# Patient Record
Sex: Female | Born: 1995 | Race: Black or African American | Hispanic: No | Marital: Single | Smoking: Never smoker
Health system: Southern US, Community
[De-identification: ages and names within clinical notes are randomized; demographics above are authoritative.]

## PROBLEM LIST (undated history)

## (undated) DIAGNOSIS — J45909 Unspecified asthma, uncomplicated: Secondary | ICD-10-CM

## (undated) DIAGNOSIS — I10 Essential (primary) hypertension: Secondary | ICD-10-CM

---

## 2015-01-10 ENCOUNTER — Emergency Department (HOSPITAL_COMMUNITY)
Admission: EM | Admit: 2015-01-10 | Discharge: 2015-01-10 | Discharge status: Against Medical Advice | Payer: Medicaid - Out of State | Attending: Emergency Medicine | Admitting: Emergency Medicine

## 2015-01-10 ENCOUNTER — Emergency Department (HOSPITAL_COMMUNITY): Payer: Medicaid - Out of State

## 2015-01-10 ENCOUNTER — Encounter (HOSPITAL_COMMUNITY): Payer: Self-pay | Admitting: *Deleted

## 2015-01-10 DIAGNOSIS — R05 Cough: Secondary | ICD-10-CM | POA: Insufficient documentation

## 2015-01-10 DIAGNOSIS — R0981 Nasal congestion: Secondary | ICD-10-CM | POA: Insufficient documentation

## 2015-01-10 DIAGNOSIS — R079 Chest pain, unspecified: Secondary | ICD-10-CM | POA: Insufficient documentation

## 2015-01-10 DIAGNOSIS — I10 Essential (primary) hypertension: Secondary | ICD-10-CM | POA: Diagnosis not present

## 2015-01-10 DIAGNOSIS — R109 Unspecified abdominal pain: Secondary | ICD-10-CM | POA: Diagnosis not present

## 2015-01-10 HISTORY — DX: Essential (primary) hypertension: I10

## 2015-01-10 LAB — COMPREHENSIVE METABOLIC PANEL
ALBUMIN: 4 g/dL (ref 3.5–5.0)
ALK PHOS: 72 U/L (ref 38–126)
ALT: 9 U/L — ABNORMAL LOW (ref 14–54)
ANION GAP: 5 (ref 5–15)
AST: 14 U/L — AB (ref 15–41)
BILIRUBIN TOTAL: 1.2 mg/dL (ref 0.3–1.2)
BUN: 7 mg/dL (ref 6–20)
CALCIUM: 9.2 mg/dL (ref 8.9–10.3)
CO2: 28 mmol/L (ref 22–32)
Chloride: 105 mmol/L (ref 101–111)
Creatinine, Ser: 0.73 mg/dL (ref 0.44–1.00)
GFR calc Af Amer: >60 mL/min (ref >60)
GFR calc non Af Amer: >60 mL/min (ref >60)
GLUCOSE: 91 mg/dL (ref 65–99)
POTASSIUM: 4.2 mmol/L (ref 3.5–5.1)
SODIUM: 138 mmol/L (ref 135–145)
Total Protein: 7.5 g/dL (ref 6.5–8.1)

## 2015-01-10 LAB — CBC
HEMATOCRIT: 38.4 % (ref 36.0–46.0)
Hemoglobin: 12.7 g/dL (ref 12.0–15.0)
MCH: 29.8 pg (ref 26.0–34.0)
MCHC: 33.1 g/dL (ref 30.0–36.0)
MCV: 90.1 fL (ref 78.0–100.0)
Platelets: 356 10*3/uL (ref 150–400)
RBC: 4.26 MIL/uL (ref 3.87–5.11)
RDW: 13.5 % (ref 11.5–15.5)
WBC: 9.3 10*3/uL (ref 4.0–10.5)

## 2015-01-10 LAB — LIPASE, BLOOD: Lipase: 26 U/L (ref 11–51)

## 2015-01-10 LAB — I-STAT TROPONIN, ED: Troponin i, poc: 0 ng/mL (ref 0.00–0.08)

## 2015-01-10 NOTE — ED Notes (Signed)
Called to take to treatment room  No response from lobby 

## 2015-01-10 NOTE — ED Notes (Signed)
Pt asked rn how long it was going to be till she got a room, rn explained she was unsure. Pt said she needed to go pick up her daughter. Unsure if pt is staying or leaving.

## 2015-01-10 NOTE — ED Notes (Signed)
Pt reports chest pain, body aches, non productive cough, nasal congestion, and abdominal pain at c section x3 days. Pain 8/10. Denies hematuria. Last bowel movement yesterday. Reports nausea, denies vomiting or diarrhea.

## 2015-01-10 NOTE — ED Notes (Signed)
Pt called to take to treatment room  No response  

## 2017-04-29 IMAGING — CR DG CHEST 2V
2 series · 2 of 2 positions shown · non-contrast
Comparison: None.

CLINICAL DATA: 19-year-old with 1 week history of mid chest pain
and shortness of breath. Current history of asthma.

EXAM:
CHEST  2 VIEW

[w chest pa]
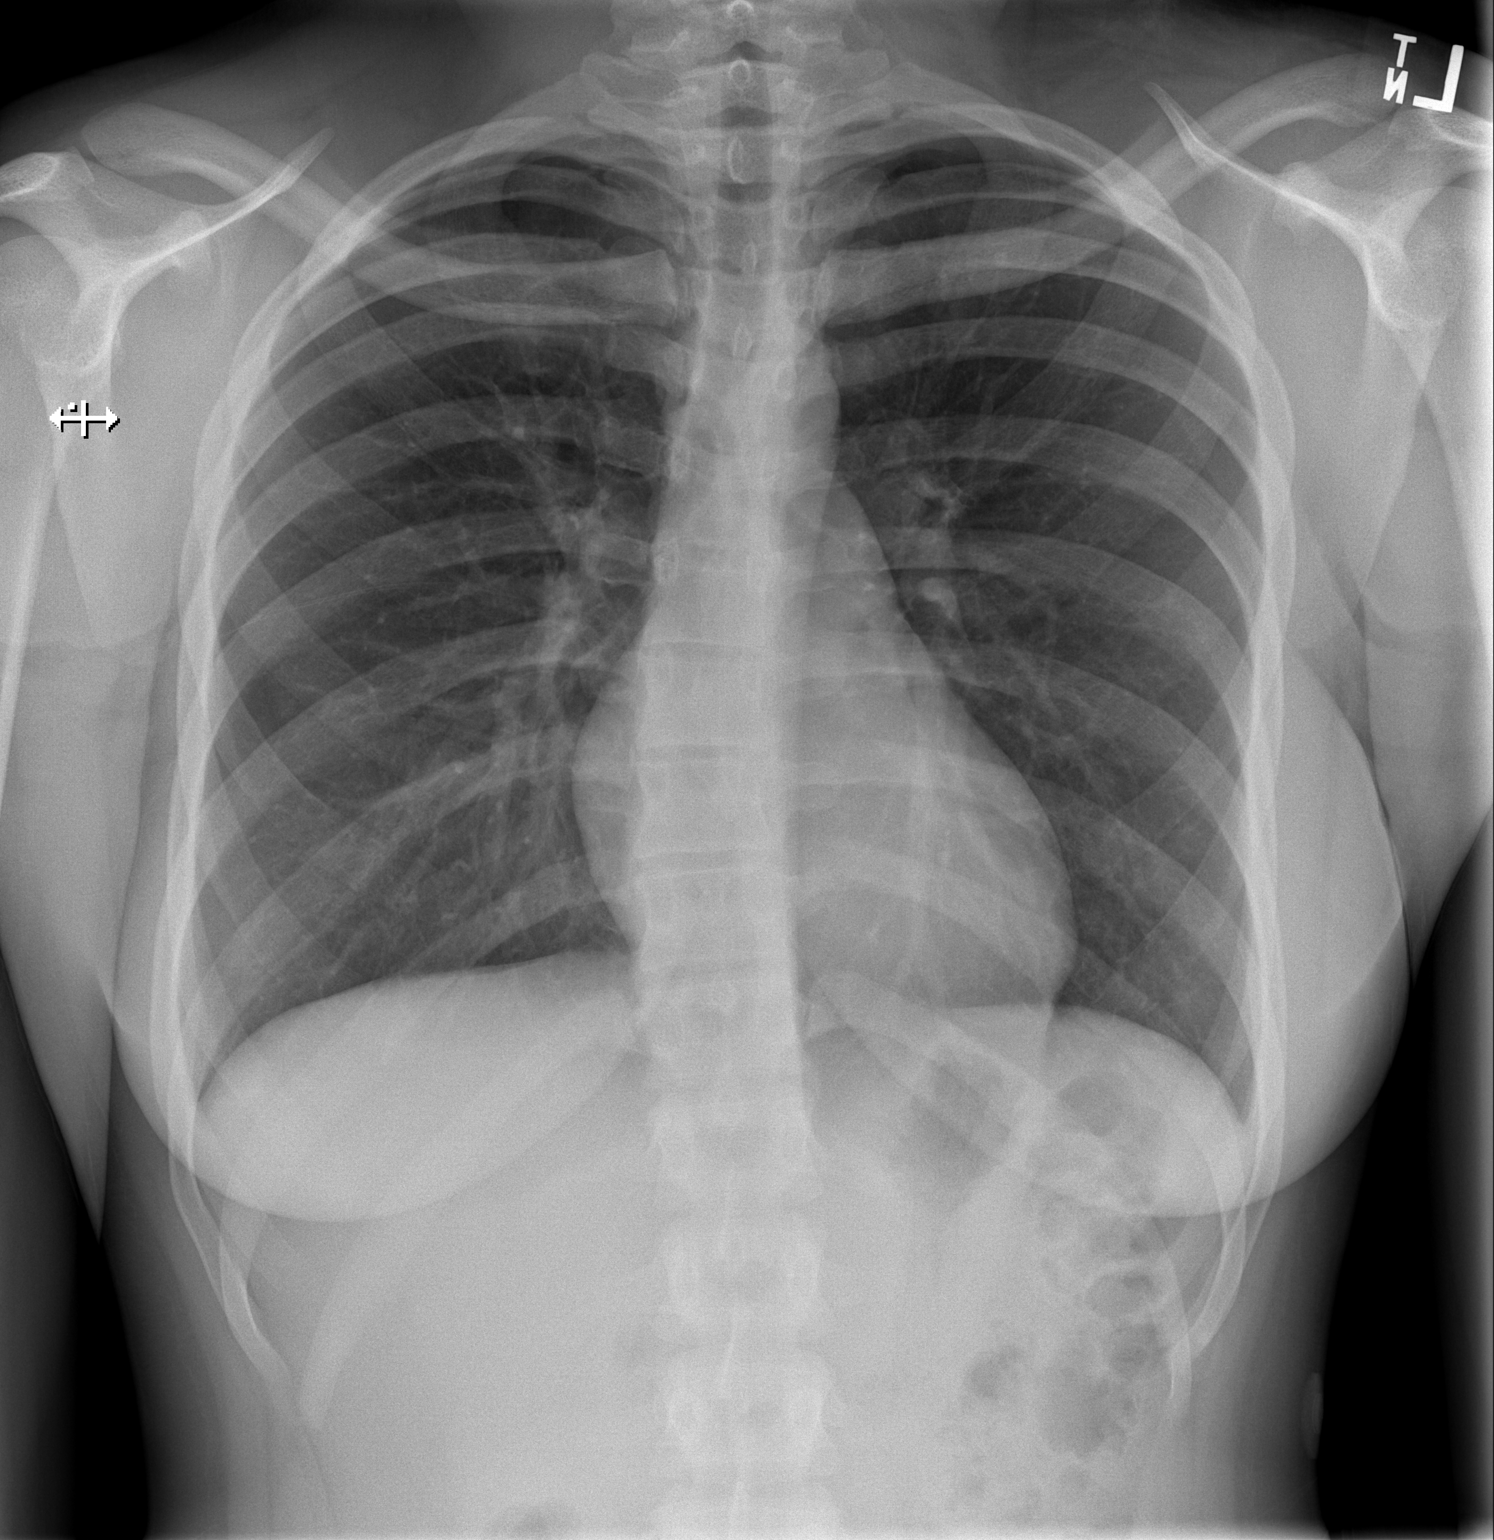

[w chest lat]
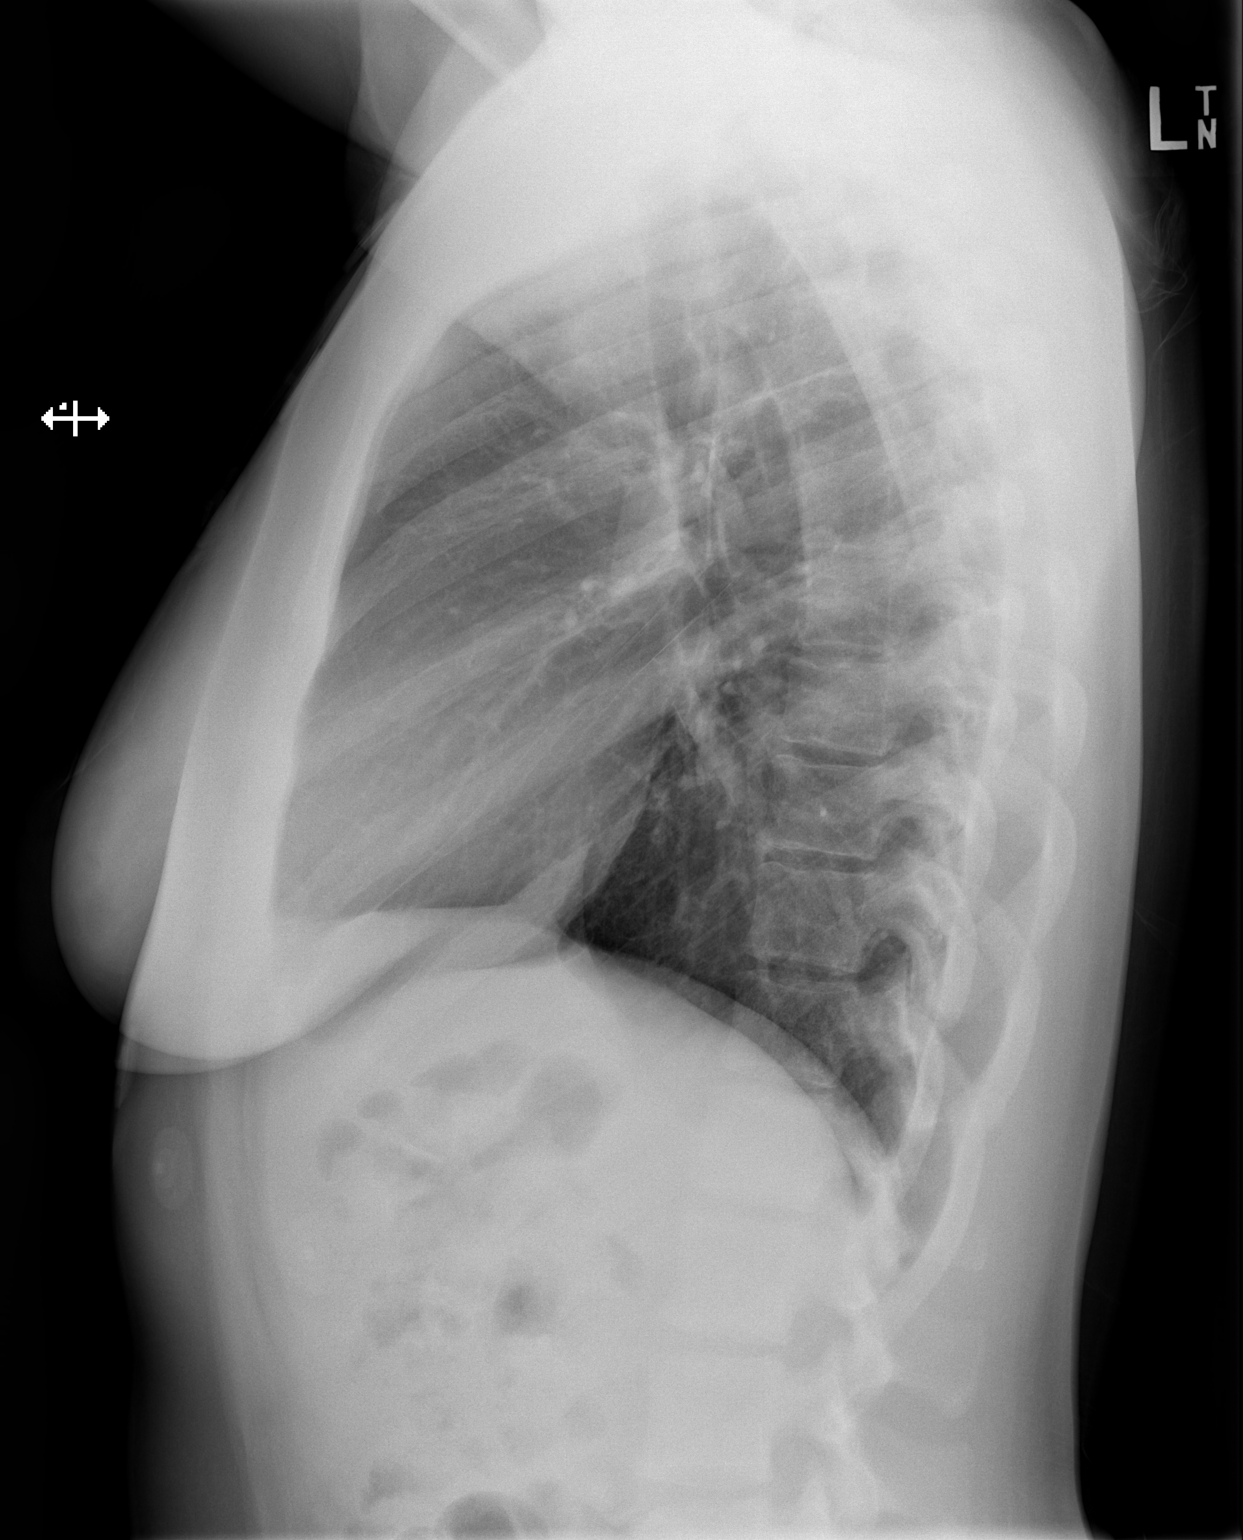

[2 of 2 positions shown; findings below may reference images not displayed]

FINDINGS: Cardiomediastinal silhouette unremarkable. Lungs clear.
Bronchovascular markings normal. Pulmonary vascularity normal. No
visible pleural effusions. No pneumothorax. Slight thoracolumbar
scoliosis convex right.
IMPRESSION: No acute cardiopulmonary disease.

## 2018-07-05 ENCOUNTER — Emergency Department (HOSPITAL_COMMUNITY): Payer: Medicaid Other

## 2018-07-05 ENCOUNTER — Encounter (HOSPITAL_COMMUNITY): Payer: Self-pay

## 2018-07-05 ENCOUNTER — Emergency Department (HOSPITAL_COMMUNITY)
Admission: EM | Admit: 2018-07-05 | Discharge: 2018-07-05 | Disposition: A | Discharge status: Home / Self Care | Payer: Medicaid Other | Attending: Emergency Medicine | Admitting: Emergency Medicine

## 2018-07-05 ENCOUNTER — Other Ambulatory Visit: Payer: Self-pay

## 2018-07-05 DIAGNOSIS — R072 Precordial pain: Secondary | ICD-10-CM | POA: Diagnosis not present

## 2018-07-05 DIAGNOSIS — R0789 Other chest pain: Secondary | ICD-10-CM | POA: Diagnosis present

## 2018-07-05 HISTORY — DX: Unspecified asthma, uncomplicated: J45.909

## 2018-07-05 LAB — CBC WITH DIFFERENTIAL/PLATELET
Abs Immature Granulocytes: 0.02 10*3/uL (ref 0.00–0.07)
Basophils Absolute: 0 10*3/uL (ref 0.0–0.1)
Basophils Relative: 0 %
Eosinophils Absolute: 0.3 10*3/uL (ref 0.0–0.5)
Eosinophils Relative: 4 %
HCT: 36.8 % (ref 36.0–46.0)
Hemoglobin: 12 g/dL (ref 12.0–15.0)
Immature Granulocytes: 0 %
Lymphocytes Relative: 31 %
Lymphs Abs: 2.2 10*3/uL (ref 0.7–4.0)
MCH: 29 pg (ref 26.0–34.0)
MCHC: 32.6 g/dL (ref 30.0–36.0)
MCV: 88.9 fL (ref 80.0–100.0)
Monocytes Absolute: 0.7 10*3/uL (ref 0.1–1.0)
Monocytes Relative: 10 %
Neutro Abs: 4 10*3/uL (ref 1.7–7.7)
Neutrophils Relative %: 55 %
Platelets: 312 10*3/uL (ref 150–400)
RBC: 4.14 MIL/uL (ref 3.87–5.11)
RDW: 12.8 % (ref 11.5–15.5)
WBC: 7.2 10*3/uL (ref 4.0–10.5)
nRBC: 0 % (ref 0.0–0.2)

## 2018-07-05 LAB — BASIC METABOLIC PANEL
Anion gap: 7 (ref 5–15)
BUN: 6 mg/dL (ref 6–20)
CO2: 26 mmol/L (ref 22–32)
Calcium: 9 mg/dL (ref 8.9–10.3)
Chloride: 106 mmol/L (ref 98–111)
Creatinine, Ser: 0.72 mg/dL (ref 0.44–1.00)
GFR calc Af Amer: >60 mL/min (ref >60)
GFR calc non Af Amer: >60 mL/min (ref >60)
Glucose, Bld: 103 mg/dL — ABNORMAL HIGH (ref 70–99)
Potassium: 3.3 mmol/L — ABNORMAL LOW (ref 3.5–5.1)
Sodium: 139 mmol/L (ref 135–145)

## 2018-07-05 LAB — I-STAT BETA HCG BLOOD, ED (MC, WL, AP ONLY): I-stat hCG, quantitative: <5 m[IU]/mL (ref <5)

## 2018-07-05 LAB — CK: Total CK: 84 U/L (ref 38–234)

## 2018-07-05 MED ORDER — SODIUM CHLORIDE 0.9 % IV BOLUS (SEPSIS)
1000.0000 mL | Freq: Once | INTRAVENOUS | Status: AC
  Administered 2018-07-05: 1000 mL via INTRAVENOUS

## 2018-07-05 NOTE — ED Triage Notes (Signed)
Pt transported from home by EMS with c/o tightness from elbow to elbow across chest, describes at tightness. Radiating up to shoulders and neck bilaterally, worse with movement. moved from [place] 3 weeks ago. Denies fever denies cough to EMS.  Pt advised she may have been dx with rhabdo but never followed up with nephrology.  #20 L hand, ASA 324mg  given.

## 2018-07-05 NOTE — ED Notes (Signed)
Patient verbalized understanding of dc instructions, vss, ambulatory with nad.   

## 2018-07-05 NOTE — ED Provider Notes (Signed)
[facility] EMERGENCY DEPARTMENT Provider Note   CSN: [id number] Arrival date & time: 07/05/18  0157    History   Chief Complaint Chief Complaint  Patient presents with  . Chest Pain    HPI Angelica Rose is a 23 y.o. female.     The history is provided by the patient.  Chest Pain  Pain location:  Substernal area Pain quality: pressure   Pain severity:  Moderate Onset quality:  Gradual Duration:  16 hours Timing:  Constant Progression:  Unchanged Chronicity:  New Context: movement and raising an arm   Relieved by:  Rest Worsened by:  Certain positions and movement (lifting arms) Associated symptoms: no abdominal pain, no cough, no fever, no lower extremity edema, no shortness of breath and no vomiting   Risk factors: hypertension   Risk factors: no prior DVT/PE and no smoking   Patient presents with chest pressure that appears to begin in her elbows and moves into her chest.  Seems to be worse with movement and certain positions.  No fever/shortness of breath/cough No trauma.  No heavy exertion. She noted it during the morning on yesterday and has been persistent She is a non-smoker, but does report drinking alcohol frequently Moved here from [place].  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  She may also have renal insufficiency as well. Does not take oral contraceptives  No known history of CAD/VTE  Past Medical History:  Diagnosis Date  . Asthma   . Hypertension     There are no active problems to display for this patient.   Past Surgical History:  Procedure Laterality Date  . CESAREAN SECTION       OB History    Gravida  1   Para  1   Term      Preterm      AB      Living  1     SAB      TAB      Ectopic      Multiple      Live Births               Home Medications    Prior to  Admission medications   Not on File    Family History History reviewed. No pertinent family history.  Social History Social History   Tobacco Use  . Smoking status: Never Smoker  . Smokeless tobacco: Never Used  Substance Use Topics  . Alcohol use: Yes    Alcohol/week: 3.0 standard drinks    Types: 3 Shots of liquor per week  . Drug use: No     Allergies   Patient has no known allergies.   Review of Systems Review of Systems  Constitutional: Negative for fever.  Respiratory: Negative for cough and shortness of breath.   Cardiovascular: Positive for chest pain.  Gastrointestinal: Negative for abdominal pain and vomiting.  All other systems reviewed and are negative.    Physical Exam Updated Vital Signs BP 123/86 (BP Location: Right Arm)   Pulse 83   Temp 98.5 F (36.9 C) (Oral)   Resp 17   Ht 1.816 m (5' 11.5")   Wt 83.9 kg   SpO2 100%   BMI 25.44 kg/m   Physical Exam CONSTITUTIONAL: Well developed/well nourished HEAD: Normocephalic/atraumatic EYES: EOMI/PERRL ENMT: Mucous membranes moist NECK: supple no  meningeal signs SPINE/BACK:entire spine nontender CV: S1/S2 noted, no murmurs/rubs/gallops noted LUNGS: Lungs are clear to auscultation bilaterally, no apparent distress Chest -Tenderness throughout upper chest, no crepitus.  Pain is worse with movement ABDOMEN: soft, nontender, no rebound or guarding, bowel sounds noted throughout abdomen GU:no cva tenderness NEURO: Pt is awake/alert/appropriate, moves all extremitiesx4.  No facial droop.   EXTREMITIES: pulses normal/equal, full ROM no lower extremity edema or tenderness.  Distal pulses are equal intact Tenderness noted to the bilateral biceps. SKIN: warm, color normal PSYCH: no abnormalities of mood noted, alert and oriented to situation   ED Treatments / Results  Labs (all labs ordered are listed, but only abnormal results are displayed) Labs Reviewed  BASIC METABOLIC PANEL - Abnormal; Notable  for the following components:      Result Value   Potassium 3.3 (*)    Glucose, Bld 103 (*)    All other components within normal limits  CBC WITH DIFFERENTIAL/PLATELET  CK  I-STAT BETA HCG BLOOD, ED ([facility], [facility], [facility] ONLY)    EKG EKG Interpretation  Date/Time:  Saturday July 05 2018 02:04:56 EDT Ventricular Rate:  86 PR Interval:    QRS Duration: 78 QT Interval:  350 QTC Calculation: 419 R Axis:   59 Text Interpretation:  Sinus rhythm RSR' in V1 or V2, probably normal variant No significant change since last tracing Confirmed by Zadie RhineWickline, Caitlen Worth ([id number]) on 07/05/2018 2:09:40 AM   Radiology Dg Chest 2 View  Result Date: 07/05/2018 CLINICAL DATA:  Chest pain EXAM: CHEST - 2 VIEW COMPARISON:  01/10/2015 FINDINGS: The heart size and mediastinal contours are within normal limits. Both lungs are clear. The visualized skeletal structures are unremarkable. IMPRESSION: No active cardiopulmonary disease. Electronically Signed   By: Deatra RobinsonKevin  Herman M.D.   On: 07/05/2018 03:25    Procedures Procedures    Medications Ordered in ED Medications  sodium chloride 0.9 % bolus 1,000 mL (1,000 mLs Intravenous New Bag/Given 07/05/18 0248)     Initial Impression / Assessment and Plan / ED Course  I have reviewed the triage vital signs and the nursing notes.  Pertinent labs & imaging results that were available during my care of the patient were reviewed by me and considered in my medical decision making (see chart for details).        Presented with chest pain that she reports went from the elbow to the other elbow across her chest.  It was easily reproducible with palpation and movement.  Her vital signs are appropriate, no hypoxia or tachycardia.  No acute EKG changes. Low suspicion for ACS/PE/dissection. Plan for discharge home. Renal function and total CK reassuringly normal Will refer to PCP Final Clinical Impressions(s) / ED Diagnoses   Final diagnoses:  Precordial pain    ED  Discharge Orders    None       Zadie RhineWickline, Len Azeez, MD 07/05/18 [phone]

## 2018-07-05 NOTE — ED Notes (Signed)
Nurse collecting labs. 

## 2018-07-05 NOTE — Discharge Instructions (Signed)
